# Patient Record
Sex: Male | Born: 1995 | Race: White | Hispanic: No | Marital: Single | Smoking: Never smoker
Health system: Southern US, Community
[De-identification: ages and names within clinical notes are randomized; demographics above are authoritative.]

## PROBLEM LIST (undated history)

## (undated) DIAGNOSIS — J45909 Unspecified asthma, uncomplicated: Secondary | ICD-10-CM

---

## 2017-09-10 ENCOUNTER — Emergency Department: Payer: Self-pay

## 2017-09-10 ENCOUNTER — Encounter: Payer: Self-pay | Admitting: Emergency Medicine

## 2017-09-10 DIAGNOSIS — R51 Headache: Secondary | ICD-10-CM | POA: Insufficient documentation

## 2017-09-10 DIAGNOSIS — G4700 Insomnia, unspecified: Secondary | ICD-10-CM | POA: Insufficient documentation

## 2017-09-10 DIAGNOSIS — R0602 Shortness of breath: Secondary | ICD-10-CM | POA: Insufficient documentation

## 2017-09-10 DIAGNOSIS — Z5321 Procedure and treatment not carried out due to patient leaving prior to being seen by health care provider: Secondary | ICD-10-CM | POA: Insufficient documentation

## 2017-09-10 NOTE — ED Triage Notes (Signed)
Pt comes into the ED via POV c/o asthma exacerbation and headache.  Patient states he has used his rescue inhaler a couple of times today with no success.  Patient has no audible wheezing at this time.  Patient in NAD with regular and symmetrical breaths.  Patient states "I also haven't slept in 6 days".  Patient explained he has had a lot going on at school.

## 2017-09-11 ENCOUNTER — Telehealth: Payer: Self-pay | Admitting: Emergency Medicine

## 2017-09-11 ENCOUNTER — Emergency Department
Admission: EM | Admit: 2017-09-11 | Discharge: 2017-09-11 | Disposition: A | Discharge status: Home / Self Care | Payer: Self-pay | Attending: Emergency Medicine | Admitting: Emergency Medicine

## 2017-09-11 HISTORY — DX: Unspecified asthma, uncomplicated: J45.909

## 2017-09-11 NOTE — ED Notes (Signed)
Patient talking on telephone.  Given up date.

## 2017-09-11 NOTE — ED Notes (Signed)
Patient walking through lobby without difficulty or distress noted.

## 2017-09-11 NOTE — Telephone Encounter (Signed)
Called patient due to lwot to inquire about condition and follow up plans. Says he does plan to follow up.  Says headache a little better as he has been drinking a lot of water.  He plans to call pcp in [place].

## 2019-03-21 IMAGING — CR DG CHEST 2V
2 series · 2 of 2 positions shown · non-contrast
Comparison: None.

CLINICAL DATA: Asthma

EXAM:
CHEST - 2 VIEW

[chest pa]
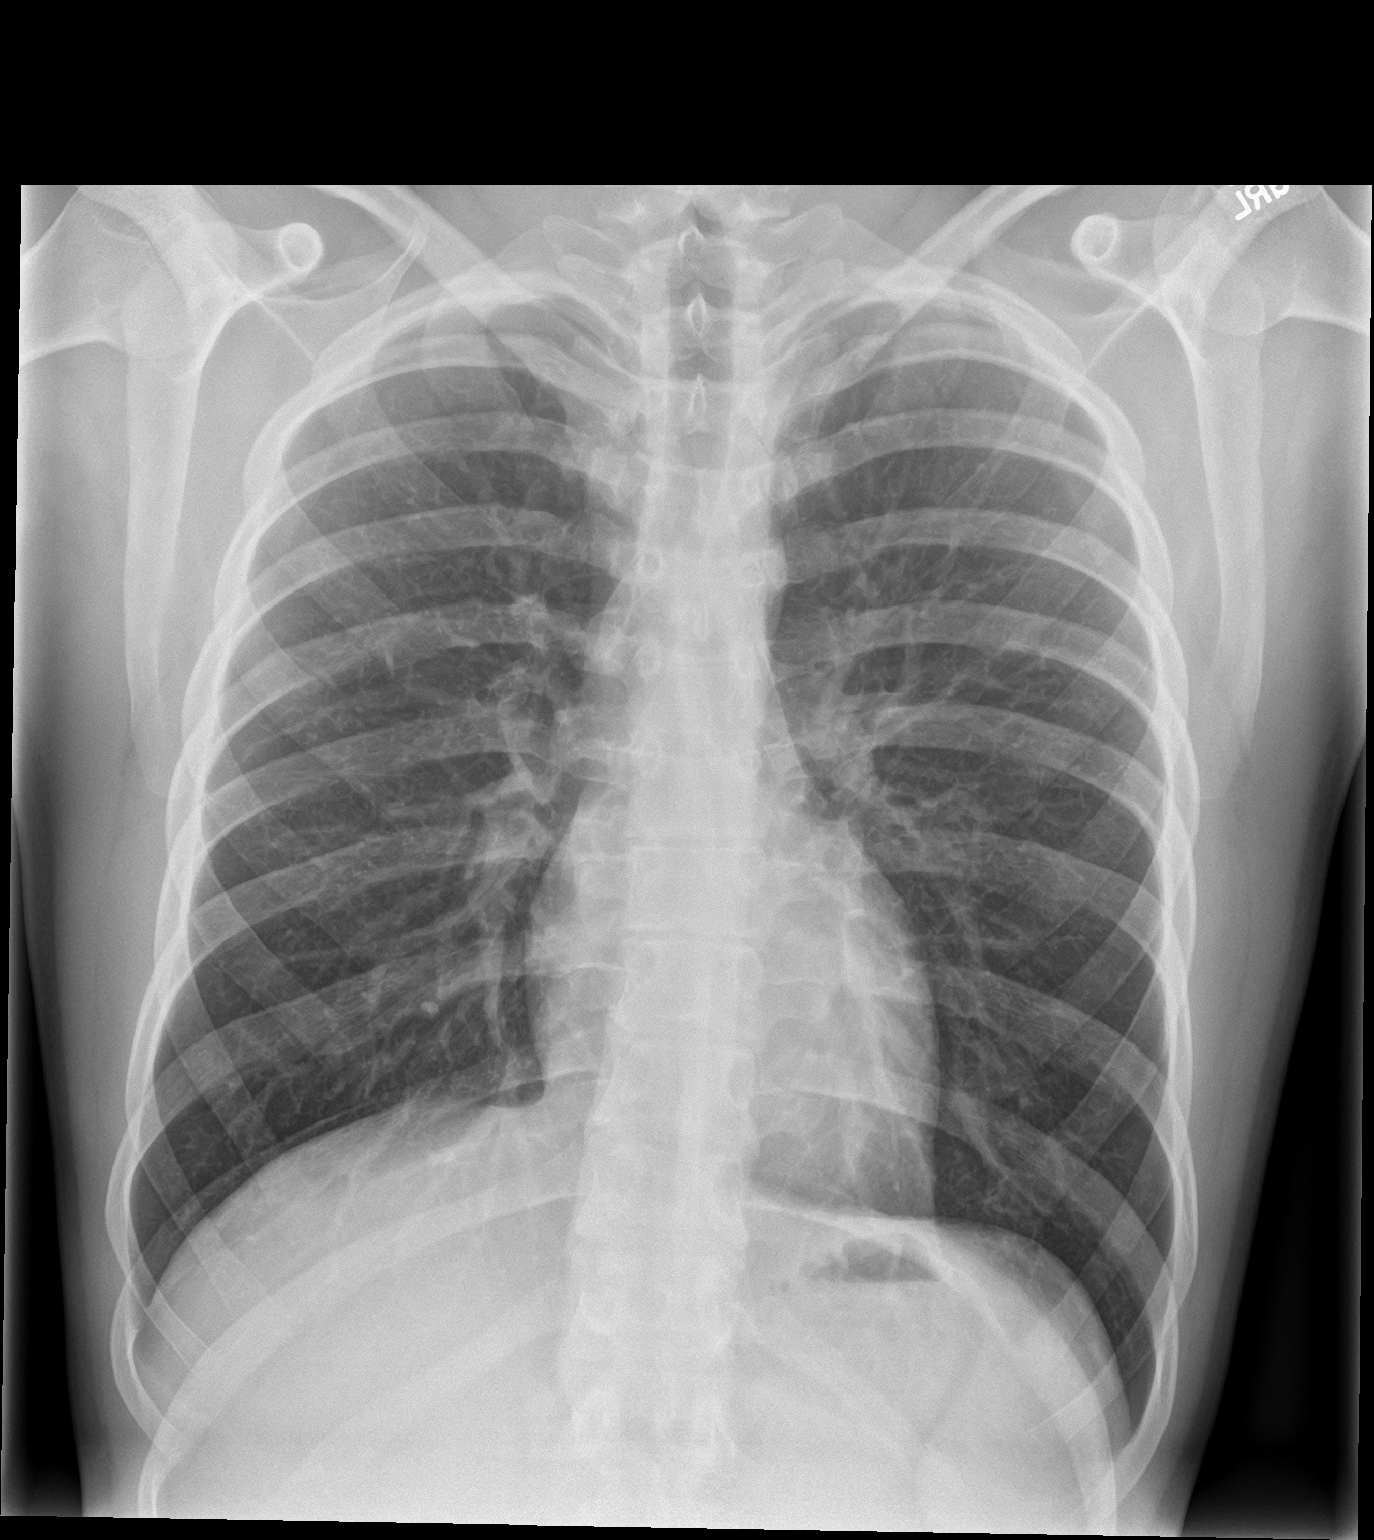

[chest lat]
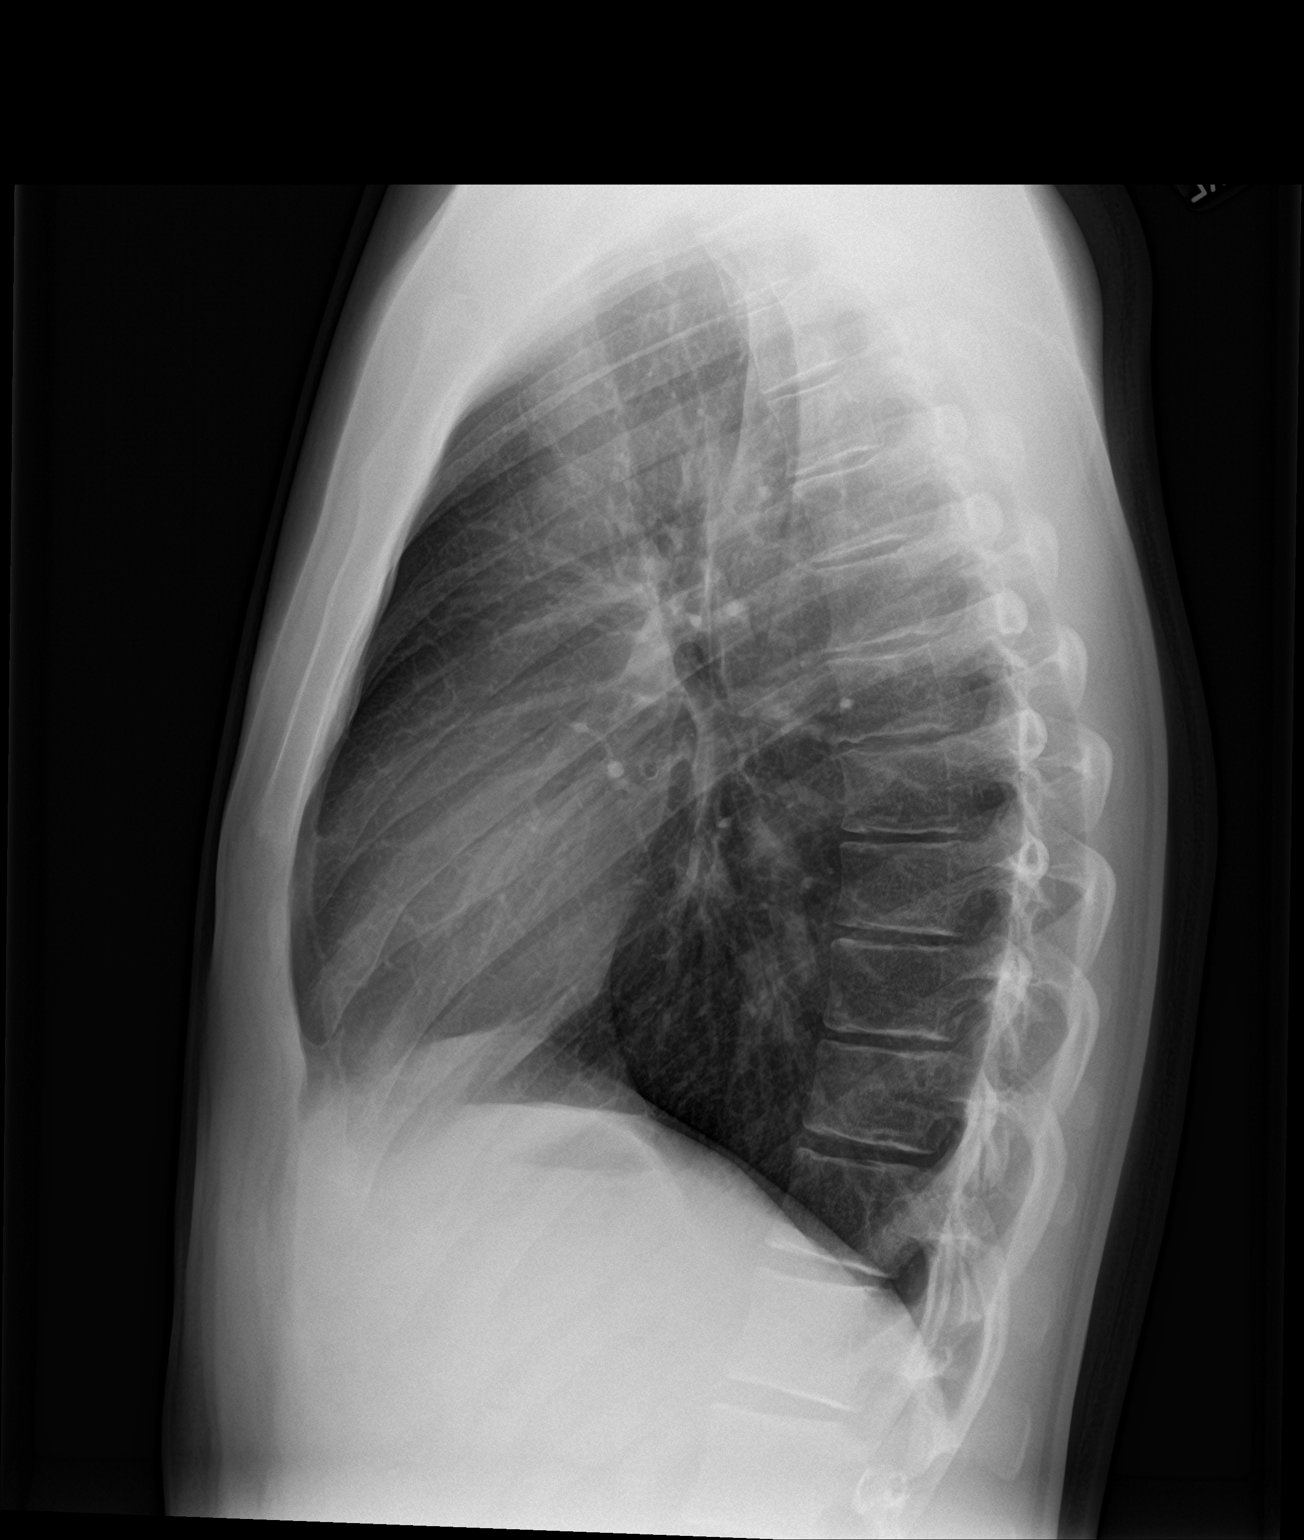

[2 of 2 positions shown; findings below may reference images not displayed]

FINDINGS: Lungs are clear. Heart size and pulmonary vascularity are normal. No
adenopathy. No pneumothorax. No bone lesions.
IMPRESSION: No edema or consolidation.
